# Patient Record
Sex: Male | Born: 1990 | Race: White | Hispanic: No | Marital: Single | Smoking: Never smoker
Health system: Southern US, Community
[De-identification: ages and names within clinical notes are randomized; demographics above are authoritative.]

---
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
## 2015-06-01 ENCOUNTER — Ambulatory Visit (INDEPENDENT_AMBULATORY_CARE_PROVIDER_SITE_OTHER): Payer: BLUE CROSS/BLUE SHIELD | Admitting: Family Medicine

## 2015-06-01 ENCOUNTER — Encounter: Payer: Self-pay | Admitting: Family Medicine

## 2015-06-01 VITALS — BP 120/88 | HR 64 | Ht 71.0 in | Wt 208.0 lb

## 2015-06-01 DIAGNOSIS — L708 Other acne: Secondary | ICD-10-CM

## 2015-06-01 DIAGNOSIS — Z0000 Encounter for general adult medical examination without abnormal findings: Secondary | ICD-10-CM

## 2015-06-01 DIAGNOSIS — L700 Acne vulgaris: Secondary | ICD-10-CM

## 2015-06-01 LAB — POCT URINALYSIS DIPSTICK
Bilirubin, UA: NEGATIVE
Blood, UA: NEGATIVE
GLUCOSE UA: NEGATIVE
KETONES UA: NEGATIVE
LEUKOCYTES UA: NEGATIVE
Nitrite, UA: NEGATIVE
Protein, UA: NEGATIVE
SPEC GRAV UA: 1.01
Urobilinogen, UA: 0.2
pH, UA: 6

## 2015-06-01 LAB — HEMOCCULT GUIAC POC 1CARD (OFFICE): FECAL OCCULT BLD: NEGATIVE

## 2015-06-01 NOTE — Patient Instructions (Signed)
Smokeless Tobacco Use Smokeless tobacco is a loose, fine, or stringy tobacco. The tobacco is not smoked like a cigarette, but it is chewed or held in the lips or cheeks. It resembles tea and comes from the leaves of the tobacco plant. Smokeless tobacco is usually flavored, sweetened, or processed in some way. Although smokeless tobacco is not smoked into the lungs, its chemicals are absorbed through the membranes in the mouth and into the bloodstream. Its chemicals are also swallowed in saliva. The chemicals (nicotine and other toxins) are known to cause cancer. Smokeless tobacco contains up to 28 differentcarcinogens. CAUSES Nicotine is addictive. Smokeless tobacco contains nicotine, which is a stimulant. This stimulant can give you a "buzz" or altered state. People can become addicted to the feeling it delivers.  SYMPTOMS Smokeless tobacco can cause health problems, including:  Bad breath.  Yellow-brown teeth.  Mouth sores.  Cracking and bleeding lips.  Gum disease, gum recession, and bone loss around the teeth.  Tooth decay.  Increased or irregular heart rate.  High blood pressure, heart disease, and stroke.  Cancer of the mouth, lips, tongue, pancreas, voice box (larynx), esophagus, colon, and bladder.  Precancerous lesion of the soft tissues of the mouth (leukoplakia).  Loss of your sense of taste. TREATMENT Talk with your caregiver about ways you can quit. Quitting tobacco is a good decision for your health. Nicotine is addictive, but several options are available to help you quit including:  Nicotine replacement therapy (gum or patch).  Support and cessation programs. The following tips can help you quit:  Write down the reasons you would like to quit and look at them often.  Set a date during a low stress time to stop or cut back.  Ask family and friends for their support.  Remove all tobacco products from your home and work.  Replace the chewing tobacco with  things like beef jerky, sunflower seeds, or shredded coconut.  Avoid situations that may make you want to chew tobacco.  Exercise and eat a healthy diet.  When you crave tobacco, distract yourself with drinking water, sugarless chewing gum, sugarless hard candy, exercising, or deep breathing. HOME CARE INSTRUCTIONS  See your dentist for regular oral health exams every 6 months.  Follow up with your caregiver as recommended. SEEK MEDICAL CARE OR DENTAL CARE IF:  You have bleeding or cracking lips, gums, or cheeks.  You have mouth sores, discolorations, or pain.  You have tooth pain.  You develop persistent irritation, burning, or sores in the mouth.  You have pain, tenderness, or numbness in the mouth.  You develop a lump, bumpy patch, or hardened skin inside the mouth.  The color changes inside your mouth (gray, white, or red spots).  You have difficulty chewing, swallowing, or speaking. Document Released: 02/20/2011 Document Revised: 12/11/2011 Document Reviewed: 02/20/2011 [employer] Patient Information 2015 [employer]. This information is not intended to replace advice given to you by your health care provider. Make sure you discuss any questions you have with your health care provider.  

## 2015-06-01 NOTE — Progress Notes (Signed)
Name: Timothy Valentine.   MRN: [id number]    DOB: 1991/04/20   Date:06/01/2015       Progress Note  Subjective  Chief Complaint  Chief Complaint  Patient presents with  . Annual Exam  . Penis Pain    always had a "lump" on penis- never bothered him until last week- got "huge"-, "had to go so I popped it"    HPI Comments: Patient with no specific subjective or objective concerns.  Penis Pain The patient's primary symptoms include penile pain. The patient's pertinent negatives include no genital injury, genital itching, genital lesions, pelvic pain, penile discharge, priapism, scrotal swelling or testicular pain. This is a recurrent problem. The current episode started in the past 7 days. The problem occurs daily. The problem has been gradually improving. The pain is mild. Pertinent negatives include no abdominal pain, anorexia, chest pain, chills, constipation, coughing, diarrhea, discolored urine, dysuria, fever, flank pain, frequency, headaches, hematuria, hesitancy, joint pain, joint swelling, nausea, painful intercourse, rash, shortness of breath, sore throat, urgency, urinary retention or vomiting. There is a penile injury. Injury mechanism: removal of comedon. Nothing aggravates the symptoms. He has tried rest (local care/neosporin) for the symptoms. There is no history of BPH, chlamydia, cryptorchidism, erectile aid use, erectile dysfunction, a femoral hernia, gonorrhea, herpes simplex, HIV, an inguinal hernia, kidney stones, prostatitis, sickle cell disease or syphilis.    No problem-specific assessment & plan notes found for this encounter.   History reviewed. No pertinent past medical history.  History reviewed. No pertinent past surgical history.  Family History  Problem Relation Age of Onset  . Diabetes Father     Social History   Social History  . Marital Status: Single    Spouse Name: N/A  . Number of Children: N/A  . Years of Education: N/A   Occupational History   . Not on file.   Social History Main Topics  . Smoking status: Never Smoker   . Smokeless tobacco: Current User    Types: Snuff  . Alcohol Use: 0.0 oz/week    0 Standard drinks or equivalent per week  . Drug Use: No  . Sexual Activity: Yes   Other Topics Concern  . Not on file   Social History Narrative  . No narrative on file    Allergies  Allergen Reactions  . Benadryl [Diphenhydramine Hcl (Sleep)]      Review of Systems  Constitutional: Positive for weight loss. Negative for fever, chills, malaise/fatigue and diaphoresis.  HENT: Negative for congestion, ear discharge, ear pain, hearing loss, nosebleeds, sore throat and tinnitus.   Eyes: Negative for blurred vision, double vision, photophobia, pain, discharge and redness.  Respiratory: Negative for cough, hemoptysis, sputum production, shortness of breath, wheezing and stridor.   Cardiovascular: Negative for chest pain, palpitations, orthopnea, claudication and leg swelling.  Gastrointestinal: Negative for nausea, vomiting, abdominal pain, diarrhea, constipation, blood in stool and anorexia.  Genitourinary: Positive for penile pain. Negative for dysuria, hesitancy, urgency, frequency, hematuria, flank pain, discharge, scrotal swelling, testicular pain and pelvic pain.  Musculoskeletal: Positive for back pain. Negative for joint pain, falls and neck pain.       "depends on what I'm doing"  Skin: Negative for itching and rash.  Neurological: Negative for dizziness, tingling, sensory change, speech change, focal weakness, seizures, loss of consciousness, weakness and headaches.  Endo/Heme/Allergies: Negative for environmental allergies and polydipsia. Does not bruise/bleed easily.  Psychiatric/Behavioral: Negative for depression.     Objective  Filed Vitals:  06/01/15 0832  BP: 120/88  Pulse: 64  Height:  (1.803 m)  Weight: 208 lb (94.348 kg)    Physical Exam  Constitutional: He is oriented to person,  place, and time and well-developed, well-nourished, and in no distress.  HENT:  Head: Normocephalic.  Right Ear: External ear normal.  Left Ear: External ear normal.  Nose: Nose normal.  Mouth/Throat: Oropharynx is clear and moist.  Eyes: Conjunctivae and EOM are normal. Pupils are equal, round, and reactive to light. Right eye exhibits no discharge. Left eye exhibits no discharge. No scleral icterus.  Neck: Normal range of motion. Neck supple. No JVD present. No tracheal deviation present. No thyromegaly present.  Cardiovascular: Normal rate, regular rhythm, normal heart sounds and intact distal pulses.  Exam reveals no gallop and no friction rub.   No murmur heard. Pulmonary/Chest: Breath sounds normal. No respiratory distress. He has no wheezes. He has no rales.  Abdominal: Soft. Bowel sounds are normal. He exhibits no mass. There is no hepatosplenomegaly. There is no tenderness. There is no rebound, no guarding and no CVA tenderness.  Genitourinary: Rectum normal and penis normal. Guaiac negative stool. No discharge found.  Musculoskeletal: Normal range of motion. He exhibits no edema or tenderness.  Lymphadenopathy:    He has no cervical adenopathy.  Neurological: He is alert and oriented to person, place, and time. He has normal sensation, normal strength, normal reflexes and intact cranial nerves. No cranial nerve deficit.  Skin: Skin is warm. No rash noted.  Psychiatric: Mood and affect normal.      Assessment & Plan  Problem List Items Addressed This Visit    None    Visit Diagnoses    Annual physical exam    -  Primary    Relevant Orders    Renal Function Panel    Lipid Profile    POCT Occult Blood Stool (Completed)    POCT Urinalysis Dipstick (Completed)    Closed comedone        stable         Dr. Elizabeth Sauer [facility]  06/01/2015

## 2015-06-02 LAB — RENAL FUNCTION PANEL
ALBUMIN: 4.7 g/dL (ref 3.5–5.5)
BUN/Creatinine Ratio: 13 (ref 8–19)
BUN: 11 mg/dL (ref 6–20)
CO2: 23 mmol/L (ref 18–29)
Calcium: 9.7 mg/dL (ref 8.7–10.2)
Chloride: 100 mmol/L (ref 97–108)
Creatinine, Ser: 0.88 mg/dL (ref 0.76–1.27)
GFR, EST AFRICAN AMERICAN: 137 mL/min/{1.73_m2} (ref >59)
GFR, EST NON AFRICAN AMERICAN: 119 mL/min/{1.73_m2} (ref >59)
Glucose: 78 mg/dL (ref 65–99)
Phosphorus: 2.4 mg/dL — ABNORMAL LOW (ref 2.5–4.5)
Potassium: 4.6 mmol/L (ref 3.5–5.2)
Sodium: 142 mmol/L (ref 134–144)

## 2015-06-02 LAB — LIPID PANEL
CHOL/HDL RATIO: 4.5 ratio (ref 0.0–5.0)
Cholesterol, Total: 194 mg/dL (ref 100–199)
HDL: 43 mg/dL (ref >39)
LDL Calculated: 138 mg/dL — ABNORMAL HIGH (ref 0–99)
Triglycerides: 65 mg/dL (ref 0–149)
VLDL CHOLESTEROL CAL: 13 mg/dL (ref 5–40)

## 2015-09-07 ENCOUNTER — Encounter: Payer: Self-pay | Admitting: Family Medicine

## 2015-09-07 ENCOUNTER — Ambulatory Visit (INDEPENDENT_AMBULATORY_CARE_PROVIDER_SITE_OTHER): Payer: BLUE CROSS/BLUE SHIELD | Admitting: Family Medicine

## 2015-09-07 VITALS — BP 120/70 | HR 80 | Ht 71.0 in | Wt 200.0 lb

## 2015-09-07 DIAGNOSIS — N469 Male infertility, unspecified: Secondary | ICD-10-CM

## 2015-09-07 NOTE — Patient Instructions (Signed)
Semen Analysis Test  WHY AM I HAVING THIS TEST?  A semen analysis test is performed to check certain aspects of the health of a man's reproductive organs (testes) and the hormone system that plays a role in semen production. Semen is a whitish secretion that is released from the penis during the final phase of orgasm (ejaculation). It is made up of liquids and nutrients from the prostate gland, seminal vesicles, and other glands. It also contains sperm cells from the testes. A single sperm cell contains one complete set of a man's genetic coding (chromosomes).  This test may be performed as a part of infertility testing, which is done to help find out reasons for the inability to have a child. When semen analysis is done for this reason, the shape (morphology), size, and movement (motility) of sperm cells will be included in the analysis. This testing may also include assessing a sperm cell's ability to penetrate an egg (fertilize) as well as the formation of genetic material (DNA).  Semen analysis testing may also be done to determine whether a previously performed vasectomy was successful. A vasectomy is a procedure done to make a man permanently infertile. It involves tying the tube that collects the sperm from the testicle. This tube is called the vas deferens. A vasectomy blocks the sperm from going through the vas deferens and penis so that the sperm will not go into the vagina during sexual intercourse.  WHAT KIND OF SAMPLE IS TAKEN?  A semen sample is required for this test.  WILL I NEED TO COLLECT SAMPLES AT HOME?  A semen sample will be collected by ejaculation into a sterile glass or plastic container provided by the lab. This can be done at home, in your health care provider's office, or in the lab.  If the sample will be collected at home, follow your health care provider's instructions about how to collect the sample. The sample should be delivered to the lab within 1 hour after collection. It should  also be protected from extreme heat or cold.  HOW DO I PREPARE FOR THE TEST?  For Infertility Testing:  Avoid sexual activity for 2-3 days before the semen sample collection. However, do not avoid ejaculation for a prolonged period because this can alter the motility of sperm cells.  For Vasectomy Success Testing:  Make sure that you ejaculate one or two times before the day of semen sample collection. This will clear the vas deferens of any sperm that were present before the vasectomy was performed.  WHAT ARE THE REFERENCE VALUES?  Reference values are considered healthy values established after testing a large group of healthy people. Reference values may vary among different people, labs, and hospitals. It is your responsibility to obtain your test results. Ask the lab or department performing the test when and how you will get your results.   · Volume: 2-5 mL.  · Liquefaction time: 20-30 minutes after collection.  · Appearance: normal (whitish in color).  · Motile/mL: greater than or equal to 10,000,000 (10 million).  · Sperm/mL: greater than or equal to 20,000,000 (20 million).  · Viscosity: greater than or equal to 3.  · Agglutination: greater than or equal to 3.  · Supravital: greater than or equal to 75% live.  · Fructose: positive.  · pH: 7.12-8.  · Sperm count (density): greater than or equal to 20 million/mL.  · Sperm motility: greater than or equal to 50% at 1 hour.  · Sperm morphology: greater   than 30% (Kruger criteria greater than 14%) normally shaped.  WHAT DO THE RESULTS MEAN?  Low sperm count, abnormal motility, or abnormal morphology of sperm cells may all cause problems with male fertility. When the semen analysis test is done to check the success of a vasectomy, the presence of sperm may mean that the surgery was not successful. Your health care provider may suggest a repeat of this test.  Talk with your health care provider to discuss your results, treatment options, and if necessary, the need  for more tests. Talk with your health care provider if you have any questions about your results.     This information is not intended to replace advice given to you by your health care provider. Make sure you discuss any questions you have with your health care provider.     Document Released: 10/13/2004 Document Revised: 10/09/2014 Document Reviewed: 02/12/2014  Elsevier Interactive Patient Education ©2016 [employer]

## 2015-09-07 NOTE — Progress Notes (Signed)
Name: Timothy FullingDavid G Bachmeier Jr.   MRN: [id number]    DOB: 08/04/1991   Date:09/07/2015       Progress Note  Subjective  Chief Complaint  Chief Complaint  Patient presents with  . Infertility    pt would like to be sent to urology for sperm count    Other Pertinent negatives include no abdominal pain, anorexia, chest pain, chills, coughing, fever, headaches, myalgias, nausea, neck pain, rash, sore throat or vomiting.  Male GU Problem The patient's pertinent negatives include no genital injury, genital itching, genital lesions, pelvic pain, penile discharge, penile pain, priapism, scrotal swelling or testicular pain. This is a recurrent problem. The problem occurs intermittently. Pertinent negatives include no abdominal pain, anorexia, chest pain, chills, constipation, coughing, diarrhea, discolored urine, dysuria, fever, flank pain, frequency, headaches, hematuria, hesitancy, joint pain, joint swelling, nausea, painful intercourse, rash, shortness of breath, sore throat, urgency, urinary retention or vomiting. There is no reported injury. There is no history of BPH, chlamydia, cryptorchidism, erectile aid use, erectile dysfunction, gonorrhea, herpes simplex, HIV, prostatitis, syphilis or varicocele.    No problem-specific assessment & plan notes found for this encounter.   History reviewed. No pertinent past medical history.  History reviewed. No pertinent past surgical history.  Family History  Problem Relation Age of Onset  . Diabetes Father     Social History   Social History  . Marital Status: Single    Spouse Name: N/A  . Number of Children: N/A  . Years of Education: N/A   Occupational History  . Not on file.   Social History Main Topics  . Smoking status: Never Smoker   . Smokeless tobacco: Current User    Types: Snuff  . Alcohol Use: 0.0 oz/week    0 Standard drinks or equivalent per week  . Drug Use: No  . Sexual Activity: Yes   Other Topics Concern  . Not on file    Social History Narrative    Allergies  Allergen Reactions  . Benadryl [Diphenhydramine Hcl (Sleep)]      Review of Systems  Constitutional: Negative for fever, chills, weight loss and malaise/fatigue.  HENT: Negative for ear discharge, ear pain and sore throat.   Eyes: Negative for blurred vision.  Respiratory: Negative for cough, sputum production, shortness of breath and wheezing.   Cardiovascular: Negative for chest pain, palpitations and leg swelling.  Gastrointestinal: Negative for heartburn, nausea, vomiting, abdominal pain, diarrhea, constipation, blood in stool, melena and anorexia.  Genitourinary: Negative for dysuria, hesitancy, urgency, frequency, hematuria, flank pain, discharge, scrotal swelling, penile pain, testicular pain and pelvic pain.  Musculoskeletal: Negative for myalgias, back pain, joint pain and neck pain.  Skin: Negative for rash.  Neurological: Negative for dizziness, tingling, sensory change, focal weakness and headaches.  Endo/Heme/Allergies: Negative for environmental allergies and polydipsia. Does not bruise/bleed easily.  Psychiatric/Behavioral: Negative for depression and suicidal ideas. The patient is not nervous/anxious and does not have insomnia.      Objective  Filed Vitals:   09/07/15 1413  BP: 120/70  Pulse: 80  Height: 5\' 11"  (1.803 m)  Weight: 200 lb (90.719 kg)    Physical Exam  Constitutional: He is well-developed, well-nourished, and in no distress. No distress.  HENT:  Head: Normocephalic.  Right Ear: Tympanic membrane and external ear normal.  Left Ear: Tympanic membrane and external ear normal.  Nose: No mucosal edema.  Mouth/Throat: No oropharyngeal exudate, posterior oropharyngeal edema or posterior oropharyngeal erythema.  Eyes: Conjunctivae and EOM are normal. Pupils  are equal, round, and reactive to light.  Neck: No JVD present. No tracheal deviation present. No thyromegaly present.  Cardiovascular: Regular rhythm  and normal heart sounds.  Exam reveals no friction rub.   No murmur heard. Pulmonary/Chest: Effort normal. He has no wheezes. He has no rales.  Abdominal: Soft. Bowel sounds are normal. He exhibits no distension. There is no tenderness.  Genitourinary: Penis normal. He exhibits no abnormal testicular mass, no testicular tenderness, no abnormal scrotal mass, no scrotal tenderness and no epididymal tenderness.  atrophy  Musculoskeletal: Normal range of motion. He exhibits no edema or tenderness.  Lymphadenopathy:    He has no cervical adenopathy.  Neurological: He is alert.      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 Elizabeth Sauer [facility]  09/07/2015

## 2016-06-08 ENCOUNTER — Emergency Department: Payer: BLUE CROSS/BLUE SHIELD

## 2016-06-08 ENCOUNTER — Emergency Department
Admission: EM | Admit: 2016-06-08 | Discharge: 2016-06-08 | Disposition: A | Discharge status: Home / Self Care | Payer: BLUE CROSS/BLUE SHIELD | Attending: Student | Admitting: Student

## 2016-06-08 DIAGNOSIS — R109 Unspecified abdominal pain: Secondary | ICD-10-CM | POA: Diagnosis not present

## 2016-06-08 DIAGNOSIS — N400 Enlarged prostate without lower urinary tract symptoms: Secondary | ICD-10-CM | POA: Insufficient documentation

## 2016-06-08 DIAGNOSIS — R112 Nausea with vomiting, unspecified: Secondary | ICD-10-CM | POA: Insufficient documentation

## 2016-06-08 DIAGNOSIS — F17200 Nicotine dependence, unspecified, uncomplicated: Secondary | ICD-10-CM | POA: Diagnosis not present

## 2016-06-08 DIAGNOSIS — R1111 Vomiting without nausea: Secondary | ICD-10-CM

## 2016-06-08 LAB — LIPASE, BLOOD: LIPASE: 30 U/L (ref 11–51)

## 2016-06-08 LAB — URINALYSIS COMPLETE WITH MICROSCOPIC (ARMC ONLY)
BILIRUBIN URINE: NEGATIVE
Bacteria, UA: NONE SEEN
Glucose, UA: NEGATIVE mg/dL
Hgb urine dipstick: NEGATIVE
KETONES UR: NEGATIVE mg/dL
Leukocytes, UA: NEGATIVE
NITRITE: NEGATIVE
PROTEIN: NEGATIVE mg/dL
SPECIFIC GRAVITY, URINE: 1.024 (ref 1.005–1.030)
pH: 5 (ref 5.0–8.0)

## 2016-06-08 LAB — BASIC METABOLIC PANEL
Anion gap: 7 (ref 5–15)
BUN: 15 mg/dL (ref 6–20)
CO2: 27 mmol/L (ref 22–32)
Calcium: 8.9 mg/dL (ref 8.9–10.3)
Chloride: 103 mmol/L (ref 101–111)
Creatinine, Ser: 0.81 mg/dL (ref 0.61–1.24)
GFR calc Af Amer: >60 mL/min (ref >60)
GLUCOSE: 96 mg/dL (ref 65–99)
POTASSIUM: 3.5 mmol/L (ref 3.5–5.1)
Sodium: 137 mmol/L (ref 135–145)

## 2016-06-08 LAB — CBC
HEMATOCRIT: 47.1 % (ref 40.0–52.0)
Hemoglobin: 16.6 g/dL (ref 13.0–18.0)
MCH: 29.3 pg (ref 26.0–34.0)
MCHC: 35.2 g/dL (ref 32.0–36.0)
MCV: 83.3 fL (ref 80.0–100.0)
Platelets: 193 10*3/uL (ref 150–440)
RBC: 5.65 MIL/uL (ref 4.40–5.90)
RDW: 13.7 % (ref 11.5–14.5)
WBC: 7.5 10*3/uL (ref 3.8–10.6)

## 2016-06-08 LAB — HEPATIC FUNCTION PANEL
ALK PHOS: 81 U/L (ref 38–126)
ALT: 55 U/L (ref 17–63)
AST: 34 U/L (ref 15–41)
Albumin: 4.5 g/dL (ref 3.5–5.0)
BILIRUBIN DIRECT: 0.2 mg/dL (ref 0.1–0.5)
BILIRUBIN INDIRECT: 0.7 mg/dL (ref 0.3–0.9)
BILIRUBIN TOTAL: 0.9 mg/dL (ref 0.3–1.2)
Total Protein: 7.5 g/dL (ref 6.5–8.1)

## 2016-06-08 MED ORDER — ONDANSETRON HCL 4 MG/2ML IJ SOLN
4.0000 mg | Freq: Once | INTRAMUSCULAR | Status: AC
  Administered 2016-06-08: 4 mg via INTRAVENOUS
  Filled 2016-06-08: qty 2

## 2016-06-08 MED ORDER — ONDANSETRON 4 MG PO TBDP: 4.0000 mg | ORAL_TABLET | Freq: Three times a day (TID) | ORAL | 0 refills | Status: AC | PRN

## 2016-06-08 MED ORDER — KETOROLAC TROMETHAMINE 30 MG/ML IJ SOLN
15.0000 mg | Freq: Once | INTRAMUSCULAR | Status: AC
  Administered 2016-06-08: 15 mg via INTRAVENOUS
  Filled 2016-06-08: qty 1

## 2016-06-08 MED ORDER — IBUPROFEN 600 MG PO TABS: 600.0000 mg | ORAL_TABLET | Freq: Four times a day (QID) | ORAL | 0 refills | Status: AC | PRN

## 2016-06-08 MED ORDER — ONDANSETRON 4 MG PO TBDP: 4.0000 mg | ORAL_TABLET | Freq: Three times a day (TID) | ORAL | 0 refills | Status: DC | PRN

## 2016-06-08 MED ORDER — IBUPROFEN 600 MG PO TABS: 600.0000 mg | ORAL_TABLET | Freq: Four times a day (QID) | ORAL | 0 refills | Status: DC | PRN

## 2016-06-08 NOTE — ED Notes (Signed)
Pt returned form US at this time.

## 2016-06-08 NOTE — ED Provider Notes (Signed)
[facility] Emergency Department Provider Note   ____________________________________________   First MD Initiated Contact with Patient 06/08/16 1112     (approximate)  I have reviewed the triage vital signs and the nursing notes.   HISTORY  Chief Complaint Abdominal Pain; Flank Pain; Emesis; and Headache    HPI Timothy FullingDavid G Brocksmith Jr. is a 25 y.o. male with no chronic medical problems who presents for evaluation of 2 days intermittent left flank pain radiating to the left abdomen, gradual, currently moderate, no modifying factors. Patient also had an episode of nonbloody nonbilious emesis this morning. Has had Chills but no measured fevers. No diarrhea. No pain or burning with urination, no hematuria. No prior ED stones. Denies any injury to his back, no history of malignancy, no history of IV drug use, no complaints of saddle anesthesia or weakness in the legs, no bowel or bladder incontinence. He denies chest pain or shortness of breath.   No past medical history on file.  There are no active problems to display for this patient.   No past surgical history on file.  Prior to Admission medications   Medication Sig Start Date End Date Taking? Authorizing Provider  ibuprofen (ADVIL,MOTRIN) 600 MG tablet Take 1 tablet (600 mg total) by mouth every 6 (six) hours as needed for moderate pain. 06/08/16   Gayla DossEryka A Nykayla Marcelli, MD  ondansetron (ZOFRAN ODT) 4 MG disintegrating tablet Take 1 tablet (4 mg total) by mouth every 8 (eight) hours as needed for nausea or vomiting. 06/08/16 06/14/16  Gayla DossEryka A Janelle Culton, MD    Allergies Benadryl [diphenhydramine hcl (sleep)]  Family History  Problem Relation Age of Onset  . Diabetes Father     Social History Social History  Substance Use Topics  . Smoking status: Never Smoker  . Smokeless tobacco: Current User    Types: Snuff  . Alcohol use 0.0 oz/week    Review of Systems Constitutional: No fever/chills Eyes: No visual  changes. ENT: No sore throat. Cardiovascular: Denies chest pain. Respiratory: Denies shortness of breath. Gastrointestinal: + abdominal pain.  + nausea, + vomiting.  No diarrhea.  No constipation. Genitourinary: Negative for dysuria. Musculoskeletal: Positive for back pain. Skin: Negative for rash. Neurological: Negative for headaches, focal weakness or numbness.  10-point ROS otherwise negative.  ____________________________________________   PHYSICAL EXAM:  Vitals:   06/08/16 1200 06/08/16 1300 06/08/16 1330 06/08/16 1348  BP: 118/63 106/62 119/64 124/65  Pulse: 84 69 73 71  Resp: 19 12 (!) 23 18  Temp:      TempSrc:      SpO2: 99% 97% 97% 96%  Weight:      Height:        VITAL SIGNS: ED Triage Vitals  Enc Vitals Group     BP 06/08/16 0910 130/74     Pulse Rate 06/08/16 0910 88     Resp 06/08/16 0910 17     Temp 06/08/16 0910 98.3 F (36.8 C)     Temp Source 06/08/16 0910 Oral     SpO2 06/08/16 0910 98 %     Weight 06/08/16 0911 200 lb (90.7 kg)     Height 06/08/16 0911 5\' 11"  (1.803 m)     Head Circumference --      Peak Flow --      Pain Score 06/08/16 0912 7     Pain Loc --      Pain Edu? --      Excl. in GC? --  Constitutional: Alert and oriented. Well appearing and in no acute distress. Eyes: Conjunctivae are normal. PERRL. EOMI. Head: Atraumatic. Nose: No congestion/rhinnorhea. Mouth/Throat: Mucous membranes are moist.  Oropharynx non-erythematous. Neck: No stridor.  Cardiovascular: Normal rate, regular rhythm. Grossly normal heart sounds.  Good peripheral circulation. Respiratory: Normal respiratory effort.  No retractions. Lungs CTAB. Gastrointestinal: Soft and nontender. No distention. Mild left CVA tenderness. Genitourinary: Deferred Rectal: performed with nurse Megan as Chaperone, nontender prostate, firm prostate is not boggy Musculoskeletal: No lower extremity tenderness nor edema.  No joint effusions. Neurologic:  Normal speech and  language. No gross focal neurologic deficits are appreciated. No gait instability. 5 out of 5 strength of dorsiflexion of the big toes bilaterally. Normal sensation in the saddle distribution. Skin:  Skin is warm, dry and intact. No rash noted. Psychiatric: Mood and affect are normal. Speech and behavior are normal.  ____________________________________________   LABS (all labs ordered are listed, but only abnormal results are displayed)  Labs Reviewed  URINALYSIS COMPLETEWITH MICROSCOPIC ([facility] ONLY) - Abnormal; Notable for the following:       Result Value   Color, Urine YELLOW (*)    APPearance CLEAR (*)    Squamous Epithelial / LPF 0-5 (*)    All other components within normal limits  URINE CULTURE  BASIC METABOLIC PANEL  CBC  HEPATIC FUNCTION PANEL  LIPASE, BLOOD   ____________________________________________  EKG  none ____________________________________________  RADIOLOGY  Renal Ultrasound IMPRESSION:  1. Normal appearance of the kidneys. No evidence of stones or  obstruction.  2. Mild prostatic enlargement produces an impression upon the  otherwise normal-appearing urinary bladder base.        ____________________________________________   PROCEDURES  Procedure(s) performed: None  Procedures  Critical Care performed: No  ____________________________________________   INITIAL IMPRESSION / ASSESSMENT AND PLAN / ED COURSE  Pertinent labs & imaging results that were available during my care of the patient were reviewed by me and considered in my medical decision making (see chart for details).  Timothy Fulling. is a 25 y.o. male with no chronic medical problems who presents for evaluation of 2 days intermittent left flank pain radiating to the left abdomen as well as nonbloody nonbilious emesis. On exam, he is generally well-appearing and in no acute distress. Vital signs stable he is afebrile. He has a benign abdominal exam, no rebound, no  guarding, no rigidity, he does have mild left CVA tenderness. I reviewed his labs. CBC and CMP are generally unremarkable. Normal lipase. Urinalysis is generally not consistent with infection of her cultures are sent. This could represent viral syndrome however obtain renal ultrasound to evaluate for any evidence of an obstructing kidney stone, treat him symptomatically with antiemetics as well as Toradol, reassess for disposition. I doubt any acute life-threatening intra-abdomino pelvic process in this well-appearing patient with reassuring lab work and benign abdominal exam.  ----------------------------------------- 1:42 PM on 06/08/2016 ----------------------------------------- Renal ultrasound shows normal kidneys, no obstruction. Prostate is mildly enlarged, he has a normal prostate exam, denies any rectal pain, no history of sexual transmitted infection, and denies pain or burning with urination or other irritative urinary complaints and I doubt that this represents prostatitis in absence of fever or leukocytosis or bacteriuria. He reports he feels much better at this time, is tolerating by mouth intake without vomiting. We discussed meticulous return precautions and need for close PCP and urology follow-up and he is comfortable with the discharge plan. He would like to go home. DC home.  Clinical Course     ____________________________________________   FINAL CLINICAL IMPRESSION(S) / ED DIAGNOSES  Final diagnoses:  Flank pain  Non-intractable vomiting without nausea, vomiting of unspecified type  Prostate enlargement      NEW MEDICATIONS STARTED DURING THIS VISIT:  New Prescriptions   IBUPROFEN (ADVIL,MOTRIN) 600 MG TABLET    Take 1 tablet (600 mg total) by mouth every 6 (six) hours as needed for moderate pain.   ONDANSETRON (ZOFRAN ODT) 4 MG DISINTEGRATING TABLET    Take 1 tablet (4 mg total) by mouth every 8 (eight) hours as needed for nausea or vomiting.     Note:  This  document was prepared using Dragon voice recognition software and may include unintentional dictation errors.    Gayla Doss, MD 06/08/16 1352

## 2016-06-08 NOTE — ED Notes (Signed)
Gave pt ginger ale.  

## 2016-06-08 NOTE — ED Notes (Signed)
Pt resting in bed on his side at this time. NAD noted. Pt's SO remains at bedside, requests 2 warm blankets. Lights are dimmed for patient comfort at this time. Will continue to monitor.

## 2016-06-08 NOTE — ED Triage Notes (Signed)
Pt arrives with reports of left sided flank pain that began a couple of days ago but has gotten worse overnight    Nausea with vomiting  headache pain present  Pt reports that he has not been able to urinate this am

## 2016-06-08 NOTE — Discharge Instructions (Signed)
You were seen in the emergency department today for left flank pain as well as nausea and vomiting. The exact cause of your symptoms is not clear however  your labs and ultrasound were reassuring and thankfully you are feeling better. Follow up with your primary care doctor and the urologist as soon as possible. Return immediately to the ED, if you have severe or worsening flank or abdominal pain, recurrent vomiting, blood in vomit or stools, fever, inability to urinate, pain or burning with urination, chest pain, difficulty breathing or for any other concerns.

## 2016-06-08 NOTE — ED Notes (Signed)
This RN with MD to bedsideto perform prostate check. Pt in NAD, pt's SO and family member stepped out for procedure, pt tolerated well.

## 2016-06-09 LAB — URINE CULTURE: Culture: NO GROWTH

## 2016-08-04 ENCOUNTER — Encounter: Payer: Self-pay | Admitting: Family Medicine

## 2016-08-04 ENCOUNTER — Ambulatory Visit (INDEPENDENT_AMBULATORY_CARE_PROVIDER_SITE_OTHER): Payer: Self-pay | Admitting: Family Medicine

## 2016-08-04 VITALS — BP 120/80 | HR 80 | Temp 98.4°F | Ht 71.0 in | Wt 221.0 lb

## 2016-08-04 DIAGNOSIS — N411 Chronic prostatitis: Secondary | ICD-10-CM

## 2016-08-04 DIAGNOSIS — J0100 Acute maxillary sinusitis, unspecified: Secondary | ICD-10-CM

## 2016-08-04 DIAGNOSIS — J40 Bronchitis, not specified as acute or chronic: Secondary | ICD-10-CM

## 2016-08-04 MED ORDER — LEVOFLOXACIN 500 MG PO TABS: 500.0000 mg | ORAL_TABLET | Freq: Every day | ORAL | 0 refills | Status: AC

## 2016-08-04 MED ORDER — LEVOFLOXACIN 500 MG PO TABS: 500.0000 mg | ORAL_TABLET | Freq: Every day | ORAL | 0 refills | Status: DC

## 2016-08-04 NOTE — Progress Notes (Signed)
Name: Timothy Valentine.   MRN: [id number]    DOB: 07/13/1991   Date:08/04/2016       Progress Note  Subjective  Chief Complaint  Chief Complaint  Patient presents with  . Sinusitis  . Prostatitis    went to ER in September- was told that prostate enlarged and to follow up    Sinusitis  This is a new problem. The current episode started in the past 7 days. The problem has been gradually worsening since onset. There has been no fever. The pain is mild. Associated symptoms include congestion, coughing, sinus pressure and a sore throat. Pertinent negatives include no chills, diaphoresis, ear pain, headaches, neck pain, shortness of breath, sneezing or swollen glands. The treatment provided mild relief.  Cough  This is a recurrent problem. The current episode started in the past 7 days. The problem has been gradually worsening. The cough is productive of purulent sputum. Associated symptoms include nasal congestion, postnasal drip and a sore throat. Pertinent negatives include no chest pain, chills, ear congestion, ear pain, fever, headaches, heartburn, myalgias, rash, rhinorrhea, shortness of breath, weight loss or wheezing. The treatment provided mild relief. There is no history of environmental allergies.    No problem-specific Assessment & Plan notes found for this encounter.   History reviewed. No pertinent past medical history.  History reviewed. No pertinent surgical history.  Family History  Problem Relation Age of Onset  . Diabetes Father     Social History   Social History  . Marital status: Single    Spouse name: N/A  . Number of children: N/A  . Years of education: N/A   Occupational History  . Not on file.   Social History Main Topics  . Smoking status: Never Smoker  . Smokeless tobacco: Current User    Types: Snuff  . Alcohol use 0.0 oz/week  . Drug use: No  . Sexual activity: Yes   Other Topics Concern  . Not on file   Social History Narrative  . No  narrative on file    Allergies  Allergen Reactions  . Benadryl [Diphenhydramine Hcl (Sleep)]      Review of Systems  Constitutional: Negative for chills, diaphoresis, fever, malaise/fatigue and weight loss.  HENT: Positive for congestion, postnasal drip, sinus pressure and sore throat. Negative for ear discharge, ear pain, rhinorrhea and sneezing.   Eyes: Negative for blurred vision.  Respiratory: Positive for cough. Negative for sputum production, shortness of breath and wheezing.   Cardiovascular: Negative for chest pain, palpitations and leg swelling.  Gastrointestinal: Negative for abdominal pain, blood in stool, constipation, diarrhea, heartburn, melena and nausea.  Genitourinary: Negative for dysuria, frequency, hematuria and urgency.  Musculoskeletal: Negative for back pain, joint pain, myalgias and neck pain.  Skin: Negative for rash.  Neurological: Negative for dizziness, tingling, sensory change, focal weakness and headaches.  Endo/Heme/Allergies: Negative for environmental allergies and polydipsia. Does not bruise/bleed easily.  Psychiatric/Behavioral: Negative for depression and suicidal ideas. The patient is not nervous/anxious and does not have insomnia.      Objective  Vitals:   08/04/16 0944  BP: 120/80  Pulse: 80  Temp: 98.4 F (36.9 C)  Weight: 221 lb (100.2 kg)  Height: 5\' 11"  (1.803 m)    Physical Exam  Constitutional: He is oriented to person, place, and time and well-developed, well-nourished, and in no distress.  HENT:  Head: Normocephalic.  Right Ear: External ear normal.  Left Ear: External ear normal.  Nose: Nose normal.  Mouth/Throat: Oropharynx is clear and moist.  Eyes: Conjunctivae and EOM are normal. Pupils are equal, round, and reactive to light. Right eye exhibits no discharge. Left eye exhibits no discharge. No scleral icterus.  Neck: Normal range of motion. Neck supple. No JVD present. No tracheal deviation present. No thyromegaly  present.  Cardiovascular: Normal rate, regular rhythm, normal heart sounds and intact distal pulses.  Exam reveals no gallop and no friction rub.   No murmur heard. Pulmonary/Chest: Breath sounds normal. No respiratory distress. He has no wheezes. He has no rales.  Abdominal: Soft. Bowel sounds are normal. He exhibits no mass. There is no hepatosplenomegaly. There is no tenderness. There is no rebound, no guarding and no CVA tenderness.  Genitourinary: Rectum normal. Prostate is tender. Prostate is not enlarged.  Musculoskeletal: Normal range of motion. He exhibits no edema or tenderness.  Lymphadenopathy:    He has no cervical adenopathy.  Neurological: He is alert and oriented to person, place, and time. He has normal sensation, normal strength, normal reflexes and intact cranial nerves. No cranial nerve deficit.  Skin: Skin is warm. No rash noted.  Psychiatric: Mood and affect normal.  Nursing note and vitals reviewed.     Assessment & Plan  Problem List Items Addressed This Visit    None    Visit Diagnoses    Acute non-recurrent maxillary sinusitis    -  Primary   Relevant Medications   levofloxacin (LEVAQUIN) 500 MG tablet   Bronchitis       Relevant Medications   levofloxacin (LEVAQUIN) 500 MG tablet   Subacute prostatitis       Relevant Medications   levofloxacin (LEVAQUIN) 500 MG tablet        Dr. Hayden Rasmusseneanna Daritza Brees [facility]  08/04/16

## 2016-08-09 ENCOUNTER — Encounter: Payer: Self-pay | Admitting: Family Medicine

## 2016-08-09 ENCOUNTER — Ambulatory Visit (INDEPENDENT_AMBULATORY_CARE_PROVIDER_SITE_OTHER): Payer: Self-pay | Admitting: Family Medicine

## 2016-08-09 VITALS — BP 110/76 | HR 74 | Temp 98.0°F | Ht 71.0 in | Wt 228.0 lb

## 2016-08-09 DIAGNOSIS — R059 Cough, unspecified: Secondary | ICD-10-CM

## 2016-08-09 DIAGNOSIS — R51 Headache: Secondary | ICD-10-CM

## 2016-08-09 DIAGNOSIS — R519 Headache, unspecified: Secondary | ICD-10-CM

## 2016-08-09 DIAGNOSIS — R05 Cough: Secondary | ICD-10-CM

## 2016-08-09 DIAGNOSIS — J069 Acute upper respiratory infection, unspecified: Secondary | ICD-10-CM

## 2016-08-09 MED ORDER — DOXYCYCLINE HYCLATE 100 MG PO TABS: 100.0000 mg | ORAL_TABLET | Freq: Two times a day (BID) | ORAL | 0 refills | Status: AC

## 2016-08-09 NOTE — Progress Notes (Signed)
Name: Timothy FullingDavid G Mclear Jr.   MRN: [id number]    DOB: 10/23/1990   Date:08/09/2016       Progress Note  Subjective  Chief Complaint  Chief Complaint  Patient presents with  . Sinusitis    has had 5 days of Levaquin-   . Sore Throat    wants something to "coat my throat"    Sinusitis  This is a chronic problem. The current episode started in the past 7 days. The problem has been waxing and waning since onset. There has been no fever. The pain is moderate. Associated symptoms include coughing and headaches. Pertinent negatives include no chills, congestion, diaphoresis, ear pain, hoarse voice, neck pain, shortness of breath, sinus pressure, sneezing, sore throat or swollen glands. Past treatments include nothing. The treatment provided mild relief.  Sore Throat   This is a chronic problem. The current episode started 1 to 4 weeks ago. The problem has been gradually worsening. There has been no fever. The pain is mild. Associated symptoms include coughing and headaches. Pertinent negatives include no abdominal pain, congestion, diarrhea, ear discharge, ear pain, hoarse voice, neck pain, shortness of breath or swollen glands. He has had no exposure to strep or mono. He has tried nothing for the symptoms. The treatment provided mild relief.  Cough  This is a recurrent problem. The current episode started 1 to 4 weeks ago. The problem has been waxing and waning. The cough is non-productive. Associated symptoms include headaches and nasal congestion. Pertinent negatives include no chest pain, chills, ear pain, fever, heartburn, myalgias, rash, sore throat, shortness of breath, weight loss or wheezing. There is no history of environmental allergies.    No problem-specific Assessment & Plan notes found for this encounter.   History reviewed. No pertinent past medical history.  History reviewed. No pertinent surgical history.  Family History  Problem Relation Age of Onset  . Diabetes Father      Social History   Social History  . Marital status: Single    Spouse name: N/A  . Number of children: N/A  . Years of education: N/A   Occupational History  . Not on file.   Social History Main Topics  . Smoking status: Never Smoker  . Smokeless tobacco: Current User    Types: Snuff  . Alcohol use 0.0 oz/week  . Drug use: No  . Sexual activity: Yes   Other Topics Concern  . Not on file   Social History Narrative  . No narrative on file    Allergies  Allergen Reactions  . Benadryl [Diphenhydramine Hcl (Sleep)]      Review of Systems  Constitutional: Negative for chills, diaphoresis, fever, malaise/fatigue and weight loss.  HENT: Negative for congestion, ear discharge, ear pain, hoarse voice, sinus pressure, sneezing and sore throat.   Eyes: Negative for blurred vision.  Respiratory: Positive for cough. Negative for sputum production, shortness of breath and wheezing.   Cardiovascular: Negative for chest pain, palpitations and leg swelling.  Gastrointestinal: Negative for abdominal pain, blood in stool, constipation, diarrhea, heartburn, melena and nausea.  Genitourinary: Negative for dysuria, frequency, hematuria and urgency.  Musculoskeletal: Negative for back pain, joint pain, myalgias and neck pain.  Skin: Negative for rash.  Neurological: Positive for headaches. Negative for dizziness, tingling, sensory change and focal weakness.  Endo/Heme/Allergies: Negative for environmental allergies and polydipsia. Does not bruise/bleed easily.  Psychiatric/Behavioral: Negative for depression and suicidal ideas. The patient is not nervous/anxious and does not have insomnia.  Objective  Vitals:   08/09/16 1112  BP: 110/76  Pulse: 74  Temp: 98 F (36.7 C)  TempSrc: Oral  SpO2: 98%  Weight: 228 lb (103.4 kg)  Height: 5\' 11"  (1.803 m)    Physical Exam  Constitutional: He is oriented to person, place, and time and well-developed, well-nourished, and in no  distress.  HENT:  Head: Normocephalic.  Right Ear: External ear normal.  Left Ear: External ear normal.  Nose: Nose normal.  Mouth/Throat: Oropharynx is clear and moist.  Eyes: Conjunctivae and EOM are normal. Pupils are equal, round, and reactive to light. Right eye exhibits no discharge. Left eye exhibits no discharge. No scleral icterus.  Neck: Normal range of motion. Neck supple. No JVD present. No tracheal deviation present. No thyromegaly present.  Cardiovascular: Normal rate, regular rhythm, normal heart sounds and intact distal pulses.  Exam reveals no gallop and no friction rub.   No murmur heard. Pulmonary/Chest: Breath sounds normal. No respiratory distress. He has no wheezes. He has no rales.  Abdominal: Soft. Bowel sounds are normal. He exhibits no mass. There is no hepatosplenomegaly. There is no tenderness. There is no rebound, no guarding and no CVA tenderness.  Musculoskeletal: Normal range of motion. He exhibits no edema or tenderness.  Lymphadenopathy:    He has no cervical adenopathy.  Neurological: He is alert and oriented to person, place, and time. He has normal sensation, normal strength, normal reflexes and intact cranial nerves. No cranial nerve deficit.  Skin: Skin is warm. No rash noted.  Psychiatric: Mood and affect normal.  Nursing note and vitals reviewed.     Assessment & Plan  Problem List Items Addressed This Visit    None    Visit Diagnoses    Upper respiratory tract infection, unspecified type    -  Primary   Relevant Orders   CBC w/Diff/Platelet   Cough       Relevant Orders   CBC w/Diff/Platelet   DG Chest 2 View   Nonintractable episodic headache, unspecified headache type       Relevant Orders   CBC w/Diff/Platelet   Lyme Disease, IgM, Early Test w/ Rflx   [place] mtn spotted fvr ab, IgG-blood        Dr. Hayden Rasmusseneanna Jones [facility]  08/09/16

## 2016-08-14 ENCOUNTER — Other Ambulatory Visit: Payer: Self-pay

## 2017-07-03 ENCOUNTER — Emergency Department
Admission: EM | Admit: 2017-07-03 | Discharge: 2017-07-03 | Disposition: A | Discharge status: Home / Self Care | Payer: Self-pay | Attending: Emergency Medicine | Admitting: Emergency Medicine

## 2017-07-03 ENCOUNTER — Encounter: Payer: Self-pay | Admitting: Emergency Medicine

## 2017-07-03 ENCOUNTER — Emergency Department: Payer: Self-pay

## 2017-07-03 DIAGNOSIS — Z79899 Other long term (current) drug therapy: Secondary | ICD-10-CM | POA: Insufficient documentation

## 2017-07-03 DIAGNOSIS — W010XXA Fall on same level from slipping, tripping and stumbling without subsequent striking against object, initial encounter: Secondary | ICD-10-CM | POA: Insufficient documentation

## 2017-07-03 DIAGNOSIS — S62015A Nondisplaced fracture of distal pole of navicular [scaphoid] bone of left wrist, initial encounter for closed fracture: Secondary | ICD-10-CM | POA: Insufficient documentation

## 2017-07-03 DIAGNOSIS — Y9389 Activity, other specified: Secondary | ICD-10-CM | POA: Insufficient documentation

## 2017-07-03 DIAGNOSIS — Y998 Other external cause status: Secondary | ICD-10-CM | POA: Insufficient documentation

## 2017-07-03 DIAGNOSIS — Y929 Unspecified place or not applicable: Secondary | ICD-10-CM | POA: Insufficient documentation

## 2017-07-03 MED ORDER — LIDOCAINE HCL 1 % IJ SOLN: 5.0000 mL | Freq: Once | INTRAMUSCULAR | Status: DC

## 2017-07-03 MED ORDER — MELOXICAM 7.5 MG PO TABS: 7.5000 mg | ORAL_TABLET | Freq: Every day | ORAL | 1 refills | Status: AC

## 2017-07-03 MED ORDER — OXYCODONE-ACETAMINOPHEN 5-325 MG PO TABS: 1.0000 | ORAL_TABLET | Freq: Three times a day (TID) | ORAL | 0 refills | Status: AC | PRN

## 2017-07-03 NOTE — ED Provider Notes (Signed)
[facility] Emergency Department Provider Note  ____________________________________________  Time seen: Approximately 10:57 PM  I have reviewed the triage vital signs and the nursing notes.   HISTORY  Chief Complaint Wrist Pain    HPI Timothy Valentine. is a 26 y.o. male presenting to the emergency department with left wrist pain after she fell backwards on an outstretched hand. He denies weakness, radiculopathy and changes in sensation of the upper extremities. No skin compromise. Patient did not hit his head or lose consciousness. He currently rates his pain at 7 out of 10 in intensity.  History reviewed. No pertinent past medical history.  There are no active problems to display for this patient.   History reviewed. No pertinent surgical history.  Prior to Admission medications   Medication Sig Start Date End Date Taking? Authorizing Provider  doxycycline (VIBRA-TABS) 100 MG tablet Take 1 tablet (100 mg total) by mouth 2 (two) times daily. 08/09/16   Duanne Limerick, MD  ibuprofen (ADVIL,MOTRIN) 600 MG tablet Take 1 tablet (600 mg total) by mouth every 6 (six) hours as needed for moderate pain. 06/08/16   Gayla Doss, MD  levofloxacin (LEVAQUIN) 500 MG tablet Take 1 tablet (500 mg total) by mouth daily. 08/04/16   Duanne Limerick, MD  meloxicam (MOBIC) 7.5 MG tablet Take 1 tablet (7.5 mg total) by mouth daily. 07/03/17 07/10/17  Orvil Feil, PA-C  oxyCODONE-acetaminophen (ROXICET) 5-325 MG tablet Take 1 tablet by mouth every 8 (eight) hours as needed for severe pain. 07/03/17 07/06/17  Orvil Feil, PA-C    Allergies Benadryl [diphenhydramine hcl (sleep)]  Family History  Problem Relation Age of Onset  . Diabetes Father     Social History Social History  Substance Use Topics  . Smoking status: Never Smoker  . Smokeless tobacco: Current User    Types: Snuff  . Alcohol use 0.0 oz/week     Review of Systems  Constitutional: No  fever/chills Eyes: No visual changes. No discharge ENT: No upper respiratory complaints. Cardiovascular: no chest pain. Respiratory: no cough. No SOB. Gastrointestinal: No abdominal pain.  No nausea, no vomiting.  No diarrhea.  No constipation. Musculoskeletal: Patient has left wrist pain.  Skin: Negative for rash, abrasions, lacerations, ecchymosis. Neurological: Negative for headaches, focal weakness or numbness.  ____________________________________________   PHYSICAL EXAM:  VITAL SIGNS: ED Triage Vitals  Enc Vitals Group     BP 07/03/17 2117 135/86     Pulse Rate 07/03/17 2117 80     Resp 07/03/17 2117 18     Temp 07/03/17 2117 98.9 F (37.2 C)     Temp Source 07/03/17 2117 Oral     SpO2 07/03/17 2117 99 %     Weight 07/03/17 2118 210 lb (95.3 kg)     Height 07/03/17 2118  (1.803 m)     Head Circumference --      Peak Flow --      Pain Score 07/03/17 2117 4     Pain Loc --      Pain Edu? --      Excl. in GC? --      Constitutional: Alert and oriented. Well appearing and in no acute distress. Eyes: Conjunctivae are normal. PERRL. EOMI. Head: Atraumatic. Cardiovascular: Normal rate, regular rhythm. Normal S1 and S2.  Good peripheral circulation. Respiratory: Normal respiratory effort without tachypnea or retractions. Lungs CTAB. Good air entry to the bases with no decreased or absent breath sounds. Musculoskeletal: Patient can  perform limited range of motion at the left wrist, likely secondary to pain. Significant pain was elicited with palpation of the anatomical snuffbox. Patient can move all 5 left fingers. Palpable radial pulse, left. Patient can perform full range of motion at the left elbow and left shoulder. Supination and pronation intact, left. Neurologic:  Normal speech and language. No gross focal neurologic deficits are appreciated.  Skin:  Skin is warm, dry and intact. No rash noted. Psychiatric: Mood and affect are normal. Speech and behavior are  normal. Patient exhibits appropriate insight and judgement.   ____________________________________________   LABS (all labs ordered are listed, but only abnormal results are displayed)  Labs Reviewed - No data to display ____________________________________________  EKG   ____________________________________________  RADIOLOGY Geraldo Pitter, personally viewed and evaluated these images (plain radiographs) as part of my medical decision making, as well as reviewing the written report by the radiologist.  Dg Wrist Complete Left  Result Date: 07/03/2017 CLINICAL DATA:  Fall with injury EXAM: LEFT WRIST - COMPLETE 3+ VIEW COMPARISON:  None. FINDINGS: There is no evidence of fracture or dislocation. There is no evidence of arthropathy or other focal bone abnormality. Soft tissues are unremarkable. IMPRESSION: Negative. Electronically Signed   By: Jasmine Pang M.D.   On: 07/03/2017 21:36   Dg Hand Complete Left  Result Date: 07/03/2017 CLINICAL DATA:  Fall with injury EXAM: LEFT HAND - COMPLETE 3+ VIEW COMPARISON:  None. FINDINGS: There is no evidence of fracture or dislocation. There is no evidence of arthropathy or other focal bone abnormality. Soft tissues are unremarkable. IMPRESSION: Negative. Electronically Signed   By: Jasmine Pang M.D.   On: 07/03/2017 21:37    ____________________________________________    PROCEDURES  Procedure(s) performed:    Procedures    Medications - No data to display   ____________________________________________   INITIAL IMPRESSION / ASSESSMENT AND PLAN / ED COURSE  Pertinent labs & imaging results that were available during my care of the patient were reviewed by me and considered in my medical decision making (see chart for details).  Review of the [place] CSRS was performed in accordance of the [facility] prior to dispensing any controlled drugs.     Assessment and plan Scaphoid fracture Patient presents to the emergency department  with left wrist pain and significant pain to palpation of the anatomical snuffbox. X-ray examination is concerning for a lucency along the distal pole of the left scaphoid. Patient was placed in a wrist splint and referred to orthopedics, Dr.Miller. Roxicet was given for pain as well as meloxicam. Vital signs are reassuring prior to discharge. ____________________________________________  FINAL CLINICAL IMPRESSION(S) / ED DIAGNOSES  Final diagnoses:  Closed nondisplaced fracture of distal pole of scaphoid bone of left wrist, initial encounter      NEW MEDICATIONS STARTED DURING THIS VISIT:  Discharge Medication List as of 07/03/2017 10:32 PM    START taking these medications   Details  meloxicam (MOBIC) 7.5 MG tablet Take 1 tablet (7.5 mg total) by mouth daily., Starting Tue 07/03/2017, Until Tue 07/10/2017, Print    oxyCODONE-acetaminophen (ROXICET) 5-325 MG tablet Take 1 tablet by mouth every 8 (eight) hours as needed for severe pain., Starting Tue 07/03/2017, Until Fri 07/06/2017, Print            This chart was dictated using voice recognition software/Dragon. Despite best efforts to proofread, errors can occur which can change the meaning. Any change was purely unintentional.    Orvil Feil, PA-C 07/03/17  [id number]    Minna Antis, MD 07/03/17 2318

## 2017-07-03 NOTE — ED Triage Notes (Signed)
Patient states that that he fell about 10:30 this morning and injured his left hand and wrist.

## 2018-04-04 IMAGING — CR DG WRIST COMPLETE 3+V*L*
4 series · 4 of 4 positions shown · non-contrast
Comparison: None.

CLINICAL DATA: Fall with injury

EXAM:
LEFT WRIST - COMPLETE 3+ VIEW

[wrist pa]
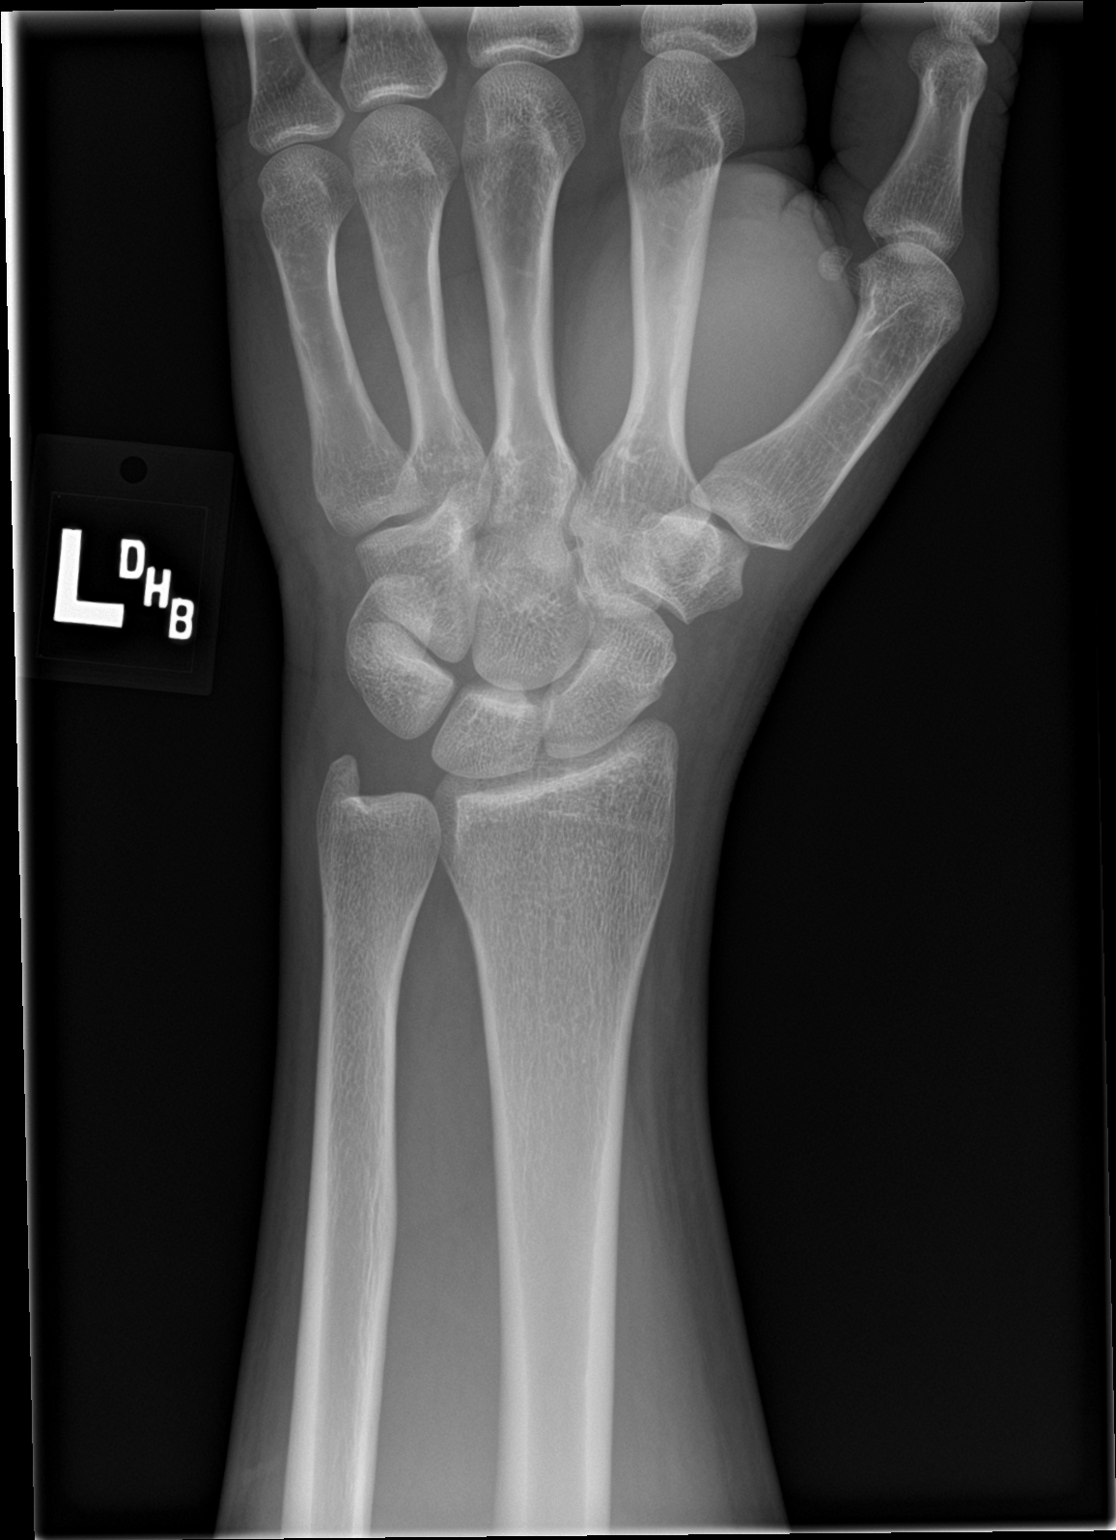

[wrist obl]
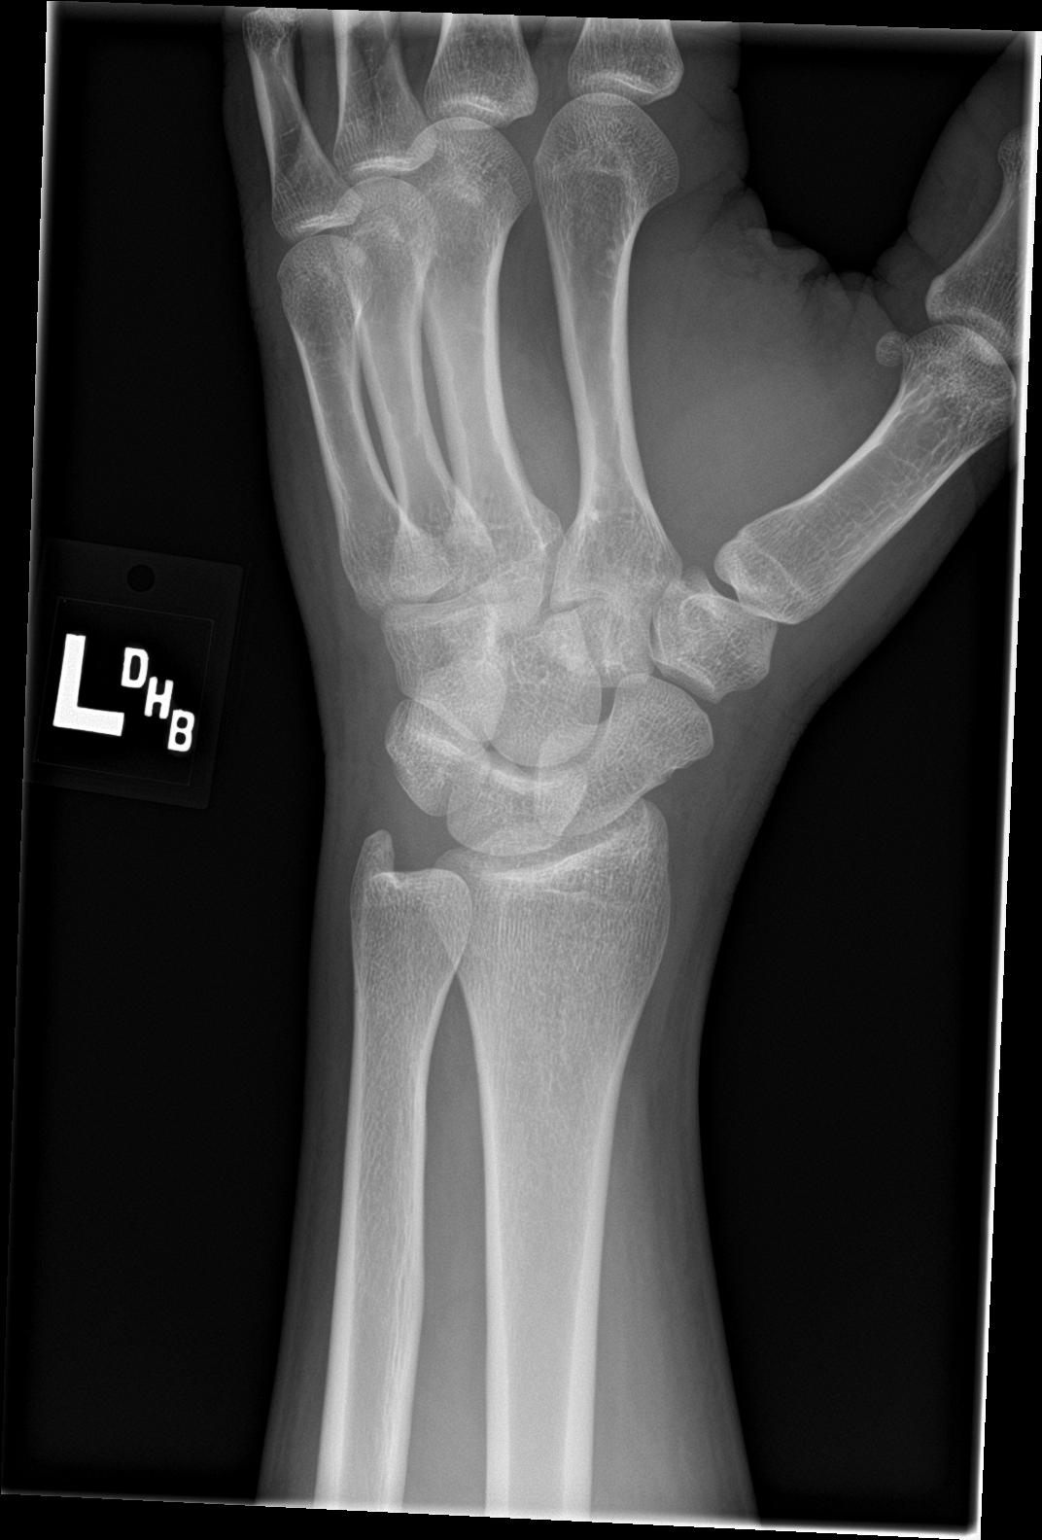

[wrist lat]
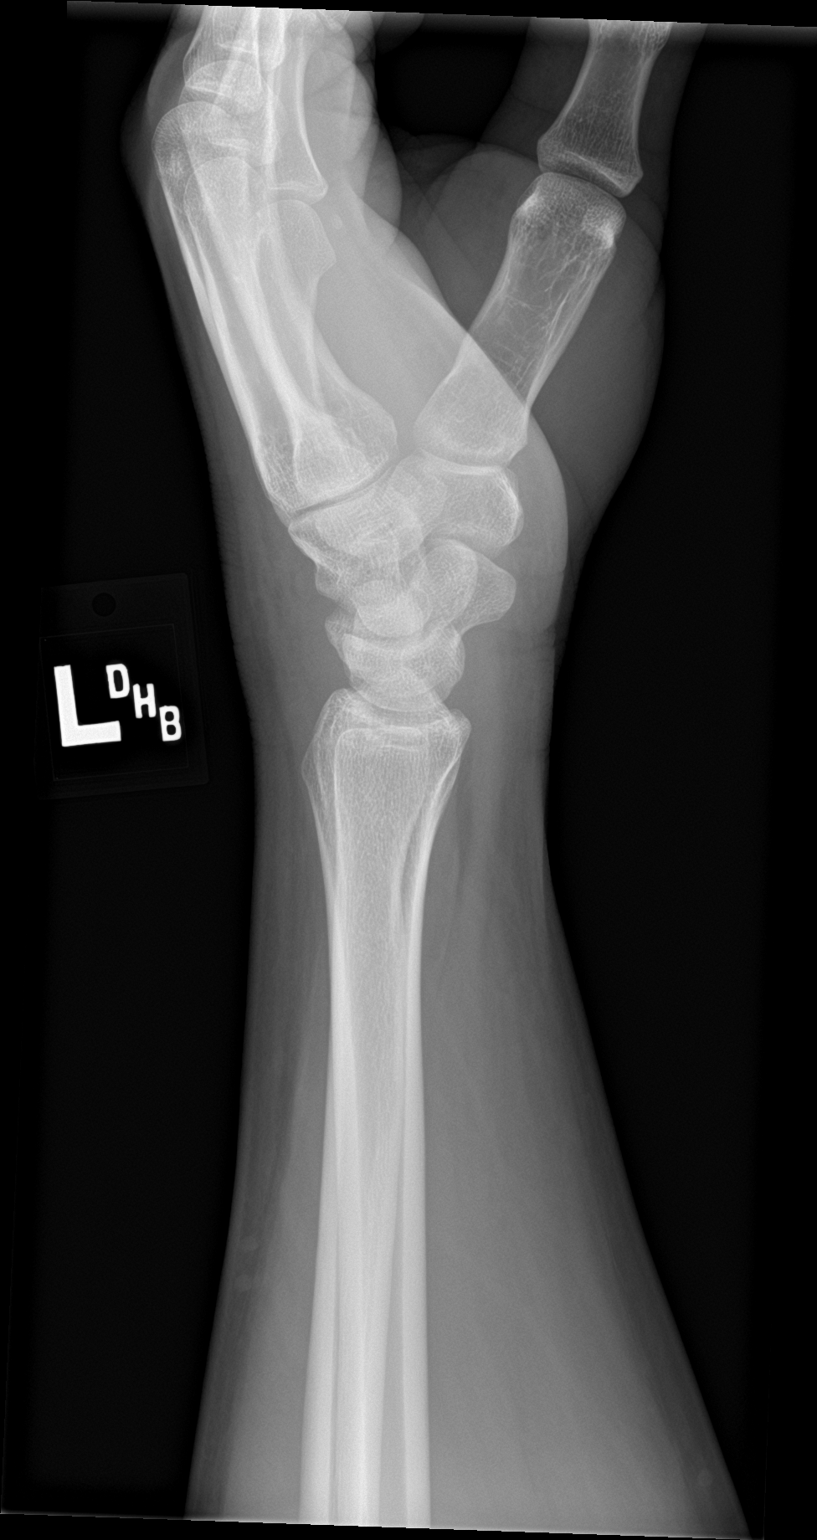

[navicular]
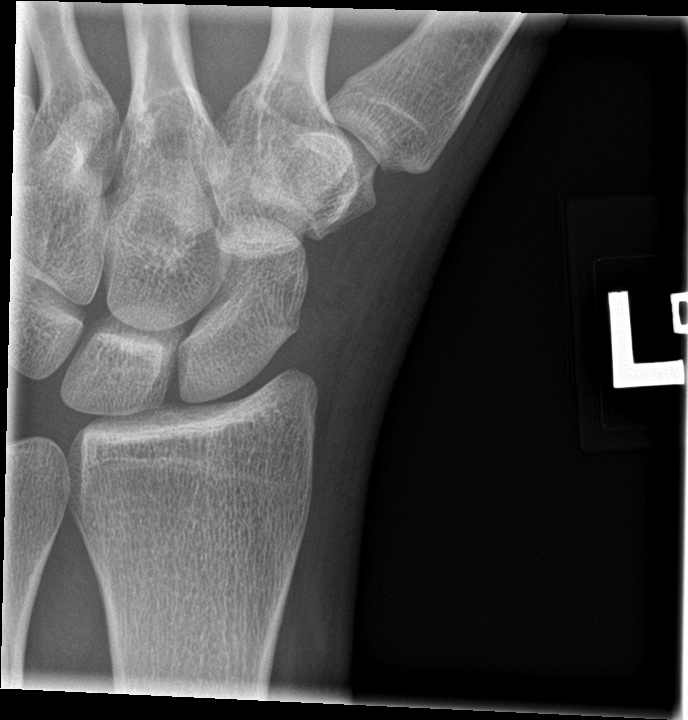

[4 of 4 positions shown; findings below may reference images not displayed]

FINDINGS: There is no evidence of fracture or dislocation. There is no
evidence of arthropathy or other focal bone abnormality. Soft
tissues are unremarkable.
IMPRESSION: Negative.

## 2018-04-29 ENCOUNTER — Other Ambulatory Visit: Payer: Self-pay

## 2018-05-20 ENCOUNTER — Ambulatory Visit: Payer: Self-pay | Admitting: Family Medicine
